# Patient Record
Sex: Female | Born: 2000 | Race: Black or African American | Hispanic: No | Marital: Single | Smoking: Never smoker
Health system: Southern US, Community
[De-identification: ages and names within clinical notes are randomized; demographics above are authoritative.]

---
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
## 2023-07-14 ENCOUNTER — Other Ambulatory Visit: Payer: Self-pay

## 2023-07-14 ENCOUNTER — Emergency Department (HOSPITAL_COMMUNITY): Payer: Medicaid Other

## 2023-07-14 ENCOUNTER — Emergency Department (HOSPITAL_COMMUNITY)
Admission: EM | Admit: 2023-07-14 | Discharge: 2023-07-14 | Discharge status: Against Medical Advice | Payer: Medicaid Other | Attending: Emergency Medicine | Admitting: Emergency Medicine

## 2023-07-14 ENCOUNTER — Encounter (HOSPITAL_COMMUNITY): Payer: Self-pay

## 2023-07-14 DIAGNOSIS — M542 Cervicalgia: Secondary | ICD-10-CM | POA: Insufficient documentation

## 2023-07-14 DIAGNOSIS — T71193A Asphyxiation due to mechanical threat to breathing due to other causes, assault, initial encounter: Secondary | ICD-10-CM

## 2023-07-14 DIAGNOSIS — S61552A Open bite of left wrist, initial encounter: Secondary | ICD-10-CM | POA: Diagnosis not present

## 2023-07-14 DIAGNOSIS — Z23 Encounter for immunization: Secondary | ICD-10-CM | POA: Diagnosis not present

## 2023-07-14 DIAGNOSIS — M25532 Pain in left wrist: Secondary | ICD-10-CM | POA: Diagnosis present

## 2023-07-14 DIAGNOSIS — W503XXA Accidental bite by another person, initial encounter: Secondary | ICD-10-CM

## 2023-07-14 LAB — BASIC METABOLIC PANEL
Anion gap: 8 (ref 5–15)
BUN: 17 mg/dL (ref 6–20)
CO2: 22 mmol/L (ref 22–32)
Calcium: 8.8 mg/dL — ABNORMAL LOW (ref 8.9–10.3)
Chloride: 108 mmol/L (ref 98–111)
Creatinine, Ser: 0.74 mg/dL (ref 0.44–1.00)
GFR, Estimated: >60 mL/min (ref >60)
Glucose, Bld: 70 mg/dL (ref 70–99)
Potassium: 3.2 mmol/L — ABNORMAL LOW (ref 3.5–5.1)
Sodium: 138 mmol/L (ref 135–145)

## 2023-07-14 LAB — CBC
HCT: 38 % (ref 36.0–46.0)
Hemoglobin: 12.7 g/dL (ref 12.0–15.0)
MCH: 30.7 pg (ref 26.0–34.0)
MCHC: 33.4 g/dL (ref 30.0–36.0)
MCV: 91.8 fL (ref 80.0–100.0)
Platelets: 277 10*3/uL (ref 150–400)
RBC: 4.14 MIL/uL (ref 3.87–5.11)
RDW: 12.6 % (ref 11.5–15.5)
WBC: 9.4 10*3/uL (ref 4.0–10.5)
nRBC: 0.2 % (ref 0.0–0.2)

## 2023-07-14 LAB — HCG, QUANTITATIVE, PREGNANCY: hCG, Beta Chain, Quant, S: <1 m[IU]/mL (ref <5)

## 2023-07-14 MED ORDER — TETANUS-DIPHTH-ACELL PERTUSSIS 5-2.5-18.5 LF-MCG/0.5 IM SUSY
0.5000 mL | PREFILLED_SYRINGE | Freq: Once | INTRAMUSCULAR | Status: AC
Start: 2023-07-14 — End: 2023-07-14
  Administered 2023-07-14: 0.5 mL via INTRAMUSCULAR
  Filled 2023-07-14: qty 0.5

## 2023-07-14 MED ORDER — AMOXICILLIN-POT CLAVULANATE 875-125 MG PO TABS
1.0000 | ORAL_TABLET | Freq: Once | ORAL | Status: AC
  Administered 2023-07-14: 1 via ORAL
  Filled 2023-07-14: qty 1

## 2023-07-14 MED ORDER — AMOXICILLIN-POT CLAVULANATE 875-125 MG PO TABS: 1.0000 | ORAL_TABLET | Freq: Two times a day (BID) | ORAL | 0 refills | Status: AC

## 2023-07-14 MED ORDER — IOHEXOL 350 MG/ML SOLN
75.0000 mL | Freq: Once | INTRAVENOUS | Status: AC | PRN
  Administered 2023-07-14: 75 mL via INTRAVENOUS
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
